# Patient Record
Sex: Male | Born: 2000 | Race: Black or African American | Hispanic: No | Marital: Single | Smoking: Never smoker
Health system: Southern US, Community
[De-identification: ages and names within clinical notes are randomized; demographics above are authoritative.]

---
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
## 2022-03-19 ENCOUNTER — Encounter (HOSPITAL_BASED_OUTPATIENT_CLINIC_OR_DEPARTMENT_OTHER): Payer: Self-pay

## 2022-03-19 ENCOUNTER — Emergency Department (HOSPITAL_BASED_OUTPATIENT_CLINIC_OR_DEPARTMENT_OTHER): Payer: Self-pay | Admitting: Radiology

## 2022-03-19 ENCOUNTER — Other Ambulatory Visit: Payer: Self-pay

## 2022-03-19 ENCOUNTER — Emergency Department (HOSPITAL_BASED_OUTPATIENT_CLINIC_OR_DEPARTMENT_OTHER)
Admission: EM | Admit: 2022-03-19 | Discharge: 2022-03-19 | Disposition: A | Discharge status: Home / Self Care | Payer: Self-pay | Attending: Emergency Medicine | Admitting: Emergency Medicine

## 2022-03-19 DIAGNOSIS — S62357A Nondisplaced fracture of shaft of fifth metacarpal bone, left hand, initial encounter for closed fracture: Secondary | ICD-10-CM

## 2022-03-19 DIAGNOSIS — S62307A Unspecified fracture of fifth metacarpal bone, left hand, initial encounter for closed fracture: Secondary | ICD-10-CM | POA: Insufficient documentation

## 2022-03-19 DIAGNOSIS — W228XXA Striking against or struck by other objects, initial encounter: Secondary | ICD-10-CM | POA: Insufficient documentation

## 2022-03-19 DIAGNOSIS — M79645 Pain in left finger(s): Secondary | ICD-10-CM | POA: Insufficient documentation

## 2022-03-19 DIAGNOSIS — Y9367 Activity, basketball: Secondary | ICD-10-CM | POA: Insufficient documentation

## 2022-03-19 NOTE — ED Provider Notes (Signed)
  [facility] EMERGENCY DEPT Provider Note   CSN: [id number] Arrival date & time: 03/19/22  2227     History  Chief Complaint  Patient presents with   Hand Injury    Alex Mendez is a 21 y.o. male.  Patient is a 21 year old male presenting with left hand pain.  Patient reports playing basketball this evening and running into the pole of the basketball pole.  He reports pain to the left hand that is worse when he moves or palpates.  He denies any alleviating factors.  The history is provided by the patient.  Hand Injury Location:  Hand Hand location:  L hand Pain details:    Quality:  Aching   Radiates to:  Does not radiate   Severity:  Moderate   Onset quality:  Sudden   Timing:  Constant   Progression:  Worsening     Home Medications Prior to Admission medications   Not on File      Allergies    Patient has no known allergies.    Review of Systems   Review of Systems  All other systems reviewed and are negative.  Physical Exam Updated Vital Signs BP (!) 142/93 (BP Location: Right Arm)   Pulse (!) 105   Temp 98.5 F (36.9 C)   Resp 18   Ht 5\' 11"  (1.803 m)   Wt 61.2 kg   SpO2 100%   BMI 18.83 kg/m  Physical Exam Vitals and nursing note reviewed.  Constitutional:      General: He is not in acute distress.    Appearance: Normal appearance. He is not ill-appearing.  HENT:     Head: Normocephalic and atraumatic.  Pulmonary:     Effort: Pulmonary effort is normal.  Musculoskeletal:     Comments: There is swelling and tenderness to the lateral aspect of the left hand over the fifth metacarpal.  Capillary refill is brisk to the fifth finger and sensation is intact.  Skin:    General: Skin is warm and dry.  Neurological:     Mental Status: He is alert.    ED Results / Procedures / Treatments   Labs (all labs ordered are listed, but only abnormal results are displayed) Labs Reviewed - No data to display  EKG None  Radiology DG  Hand Complete Left  Result Date: 03/19/2022 CLINICAL DATA:  Left hand pain/swelling, basketball injury EXAM: LEFT HAND - COMPLETE 3+ VIEW COMPARISON:  None Available. FINDINGS: Midshaft fracture of the 5th metacarpal with mild apex dorsal angulation. Associated mild soft tissue swelling. IMPRESSION: Midshaft 5th metacarpal fracture, as above. Electronically Signed   By: 03/21/2022 M.D.   On: 03/19/2022 22:53    Procedures Procedures    Medications Ordered in ED Medications - No data to display  ED Course/ Medical Decision Making/ A&P  X-rays show 1/5 metacarpal fracture as described in the radiology report.  Patient to be placed in an ulnar gutter splint and will follow-up with hand surgery in the next 1 to 2 days.  In the meantime he is to elevate and ice.  Final Clinical Impression(s) / ED Diagnoses Final diagnoses:  None    Rx / DC Orders ED Discharge Orders     None         03/21/2022, MD 03/19/22 2314

## 2022-03-19 NOTE — ED Triage Notes (Signed)
Patient here POV from Home.  Endorses playing Basketball a few Hours PTA and hitting his Left Posterior Hand on the Pole of the [place].  No Other Injuries. Swelling and Pain to Same.  NAD noted during Triage. A&Ox4. GCS 15. Ambulatory.

## 2022-03-19 NOTE — Discharge Instructions (Signed)
Wear splint as applied until followed up by hand surgery.  Ice for 20 minutes every 2 hours while awake for the next 2 days.  Follow-up in the orthopedic clinic in the next 1 to 2 days.  The contact information for Dr. Greta Doom has been provided in this discharge summary for you to call and make these arrangements.  Take ibuprofen 600 mg every 6 hours as needed for pain.

## 2022-07-24 ENCOUNTER — Ambulatory Visit
Admission: EM | Admit: 2022-07-24 | Discharge: 2022-07-24 | Disposition: A | Discharge status: Home / Self Care | Payer: Self-pay | Attending: Urgent Care | Admitting: Urgent Care

## 2022-07-24 DIAGNOSIS — K047 Periapical abscess without sinus: Secondary | ICD-10-CM

## 2022-07-24 MED ORDER — NAPROXEN 500 MG PO TABS: 500.0000 mg | ORAL_TABLET | Freq: Two times a day (BID) | ORAL | 0 refills | Status: AC

## 2022-07-24 MED ORDER — AMOXICILLIN-POT CLAVULANATE 875-125 MG PO TABS: 1.0000 | ORAL_TABLET | Freq: Two times a day (BID) | ORAL | 0 refills | Status: DC

## 2022-07-24 NOTE — ED Triage Notes (Signed)
Pt states gum pain right side of his mouth with swelling for the past 3 days.

## 2022-07-24 NOTE — Discharge Instructions (Signed)
Make sure you schedule an appointment with a dentist/dental surgeon as soon as possible.  You may try some of the resources below.    [facility] in [place], [place] Address: [address], [place], [place] [address] Phone: [phone]  [facility] [phone] extension [phone]  Dr. Civils [phone] [address]  [facility] [phone] [address]  [facility] [phone] extension [phone] [place], [place], [address] First come first serve for the first 10 clients.  May do simple extractions only, no wisdom teeth or surgery.  You may try the second for Thursday of the month starting at 6:30 AM.  [employer] You may call the school to see if they are still helping to provide dental care for emergent cases.  

## 2022-07-24 NOTE — ED Provider Notes (Signed)
  [facility]  Note:  This document was prepared using Systems analyst and may include unintentional dictation errors.  MRN: [id number] DOB: [birth year]  Subjective:   Alex Mendez is a 21 y.o. male presenting for 3-day history of acute onset persistent and worsening severe right-sided oral pain, facial pain, facial swelling.  Patient does not have a dentist.  No current facility-administered medications for this encounter. No current outpatient medications on file.   No Known Allergies  History reviewed. No pertinent past medical history.   History reviewed. No pertinent surgical history.  History reviewed. No pertinent family history.  Social History   Tobacco Use   Smoking status: Never  Substance Use Topics   Alcohol use: Never   Drug use: Yes    Frequency: 1.0 times per week    Types: Marijuana    ROS   Objective:   Vitals: BP 127/76 (BP Location: Left Arm)   Pulse 80   Temp 98.2 F (36.8 C) (Oral)   Resp 16   SpO2 97%   Physical Exam Constitutional:      General: He is not in acute distress.    Appearance: Normal appearance. He is well-developed and normal weight. He is not ill-appearing, toxic-appearing or diaphoretic.  HENT:     Head: Normocephalic and atraumatic.     Right Ear: External ear normal.     Left Ear: External ear normal.     Nose: Nose normal.     Mouth/Throat:     Pharynx: Oropharynx is clear. No pharyngeal swelling, oropharyngeal exudate, posterior oropharyngeal erythema or uvula swelling.     Tonsils: No tonsillar exudate or tonsillar abscesses. 0 on the right. 0 on the left.      Comments: Poor dentition throughout. Eyes:     General: No scleral icterus.       Right eye: No discharge.        Left eye: No discharge.     Extraocular Movements: Extraocular movements intact.  Cardiovascular:     Rate and Rhythm: Normal rate.  Pulmonary:     Effort: Pulmonary effort is normal.   Musculoskeletal:     Cervical back: Normal range of motion.  Neurological:     Mental Status: He is alert and oriented to person, place, and time.  Psychiatric:        Mood and Affect: Mood normal.        Behavior: Behavior normal.        Thought Content: Thought content normal.        Judgment: Judgment normal.     Assessment and Plan :   PDMP not reviewed this encounter.  1. Dental abscess     Start Augmentin for dental infection/abscess, use naproxen for pain and inflammation. Emphasized need for dental surgeon consult. Counseled patient on potential for adverse effects with medications prescribed/recommended today, strict ER and return-to-clinic precautions discussed, patient verbalized understanding.    Jaynee Eagles, [place] 07/24/22 [phone]

## 2022-11-04 IMAGING — DX DG HAND COMPLETE 3+V*L*
3 series · 3 of 3 positions shown · non-contrast
Comparison: None Available.

CLINICAL DATA: Left hand pain/swelling, basketball injury

EXAM:
LEFT HAND - COMPLETE 3+ VIEW

[hand ap]
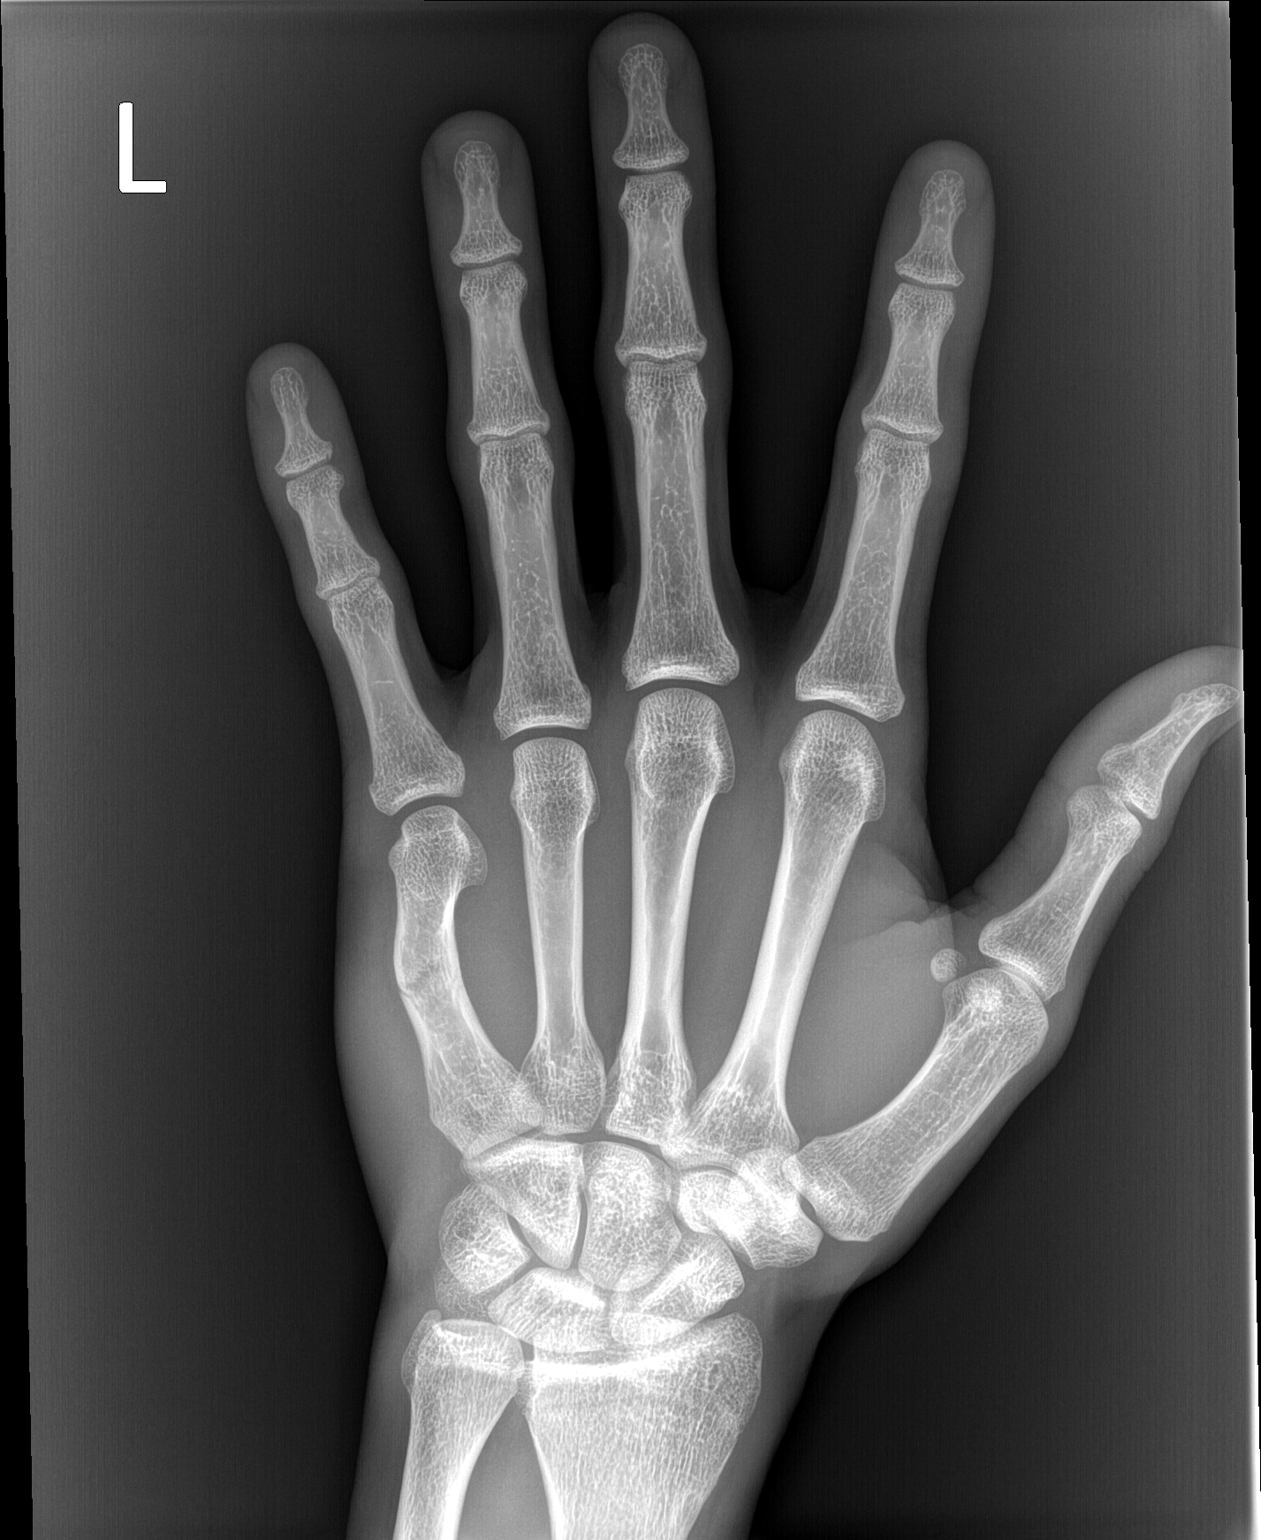

[hand obl]
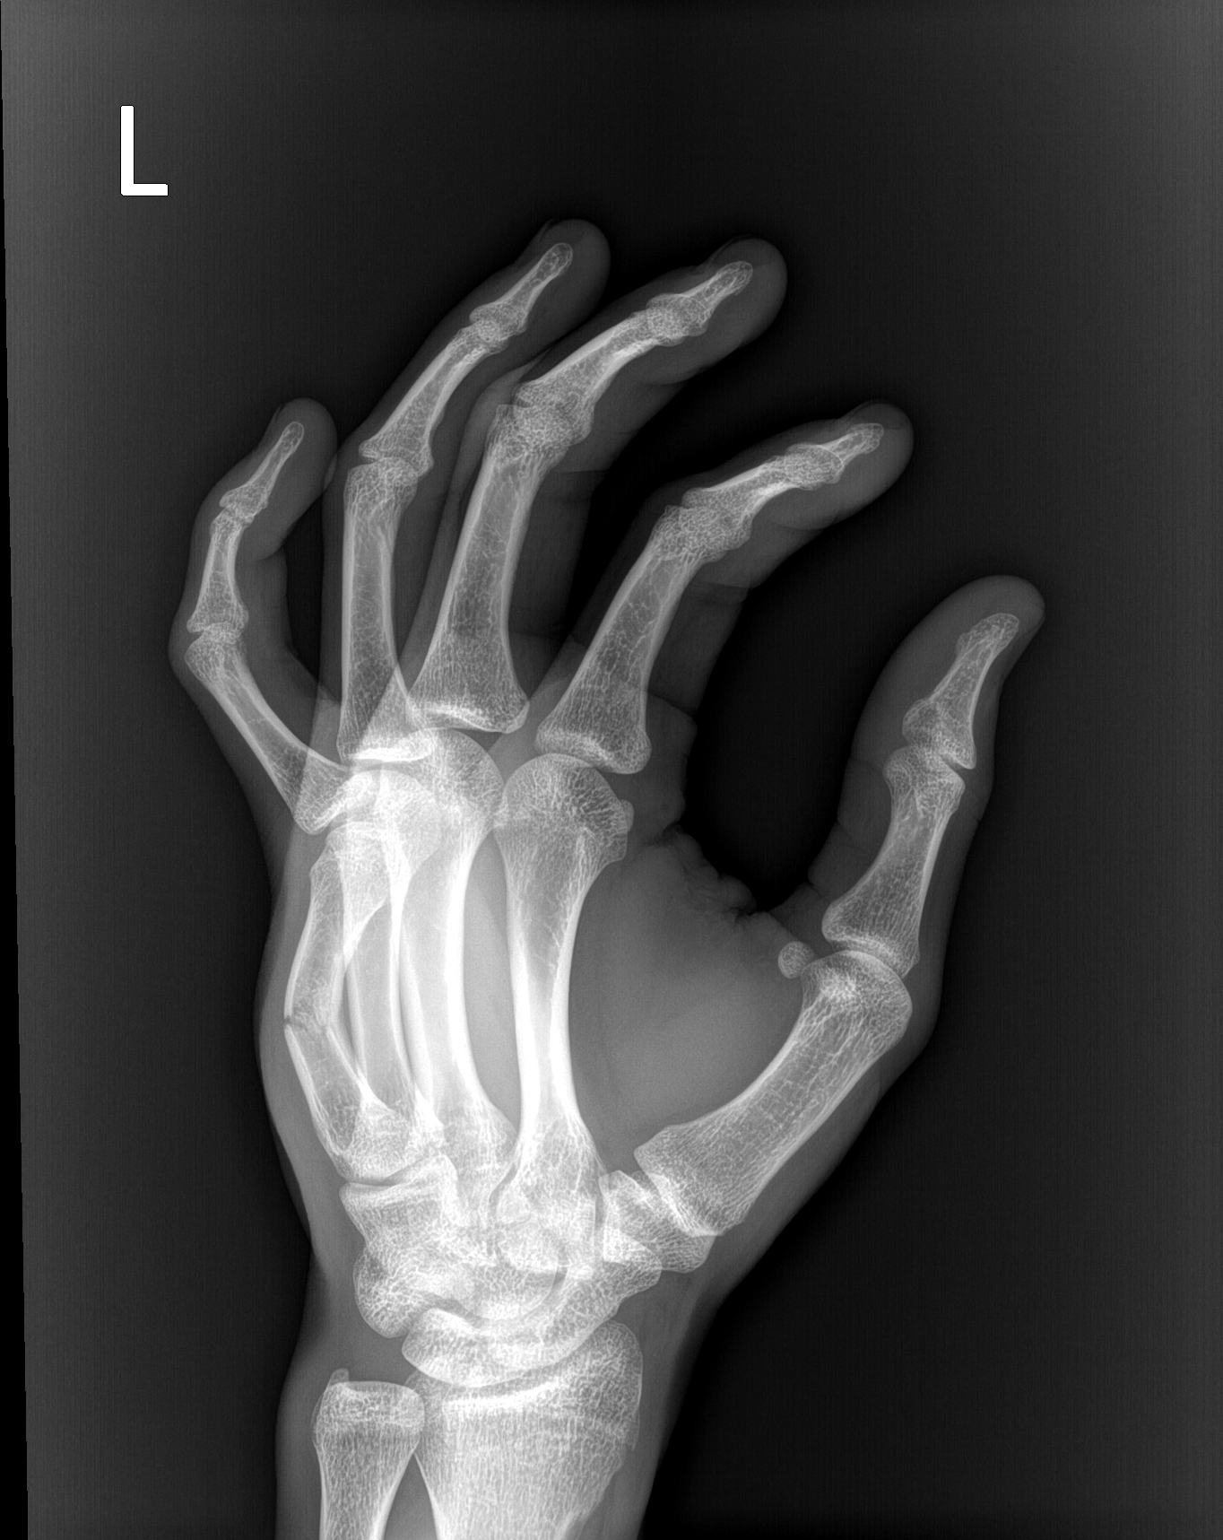

[hand lat]
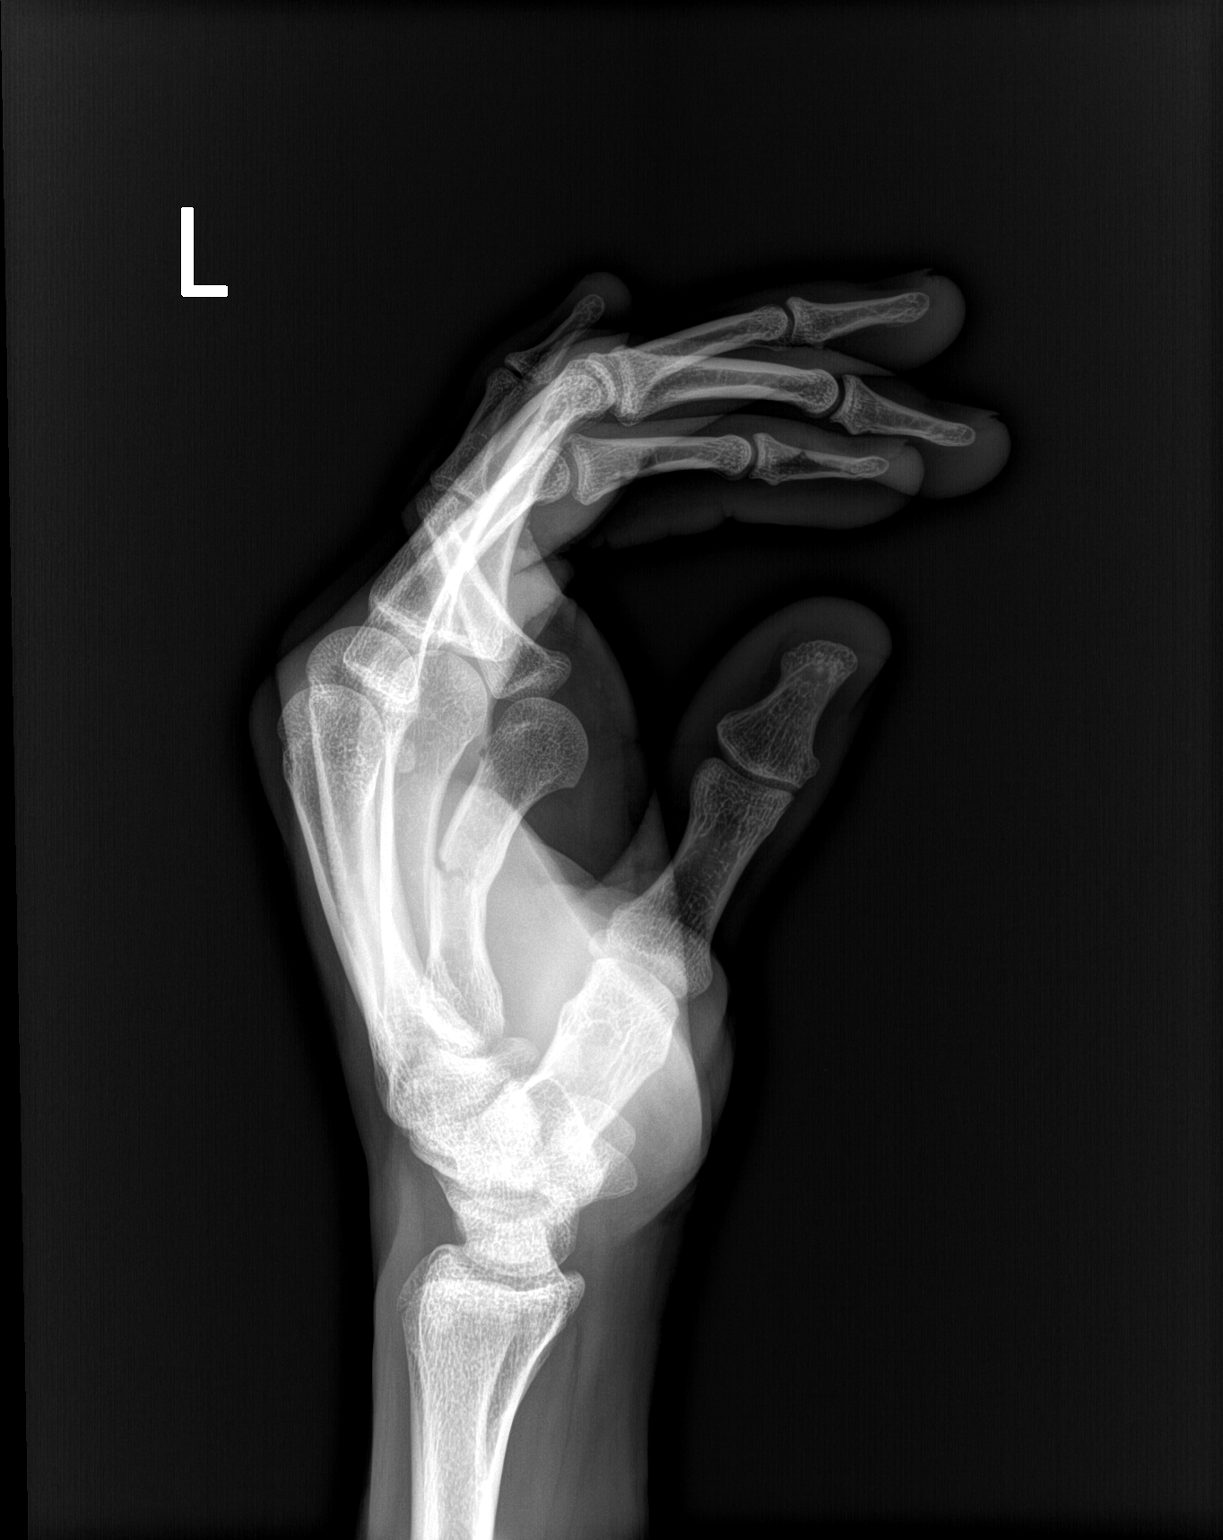

[3 of 3 positions shown; findings below may reference images not displayed]

FINDINGS: Midshaft fracture of the 5th metacarpal with mild apex dorsal
angulation.

Associated mild soft tissue swelling.
IMPRESSION: Midshaft 5th metacarpal fracture, as above.

## 2022-12-25 ENCOUNTER — Emergency Department (HOSPITAL_COMMUNITY)
Admission: EM | Admit: 2022-12-25 | Discharge: 2022-12-25 | Disposition: A | Discharge status: Home / Self Care | Payer: Self-pay | Attending: Emergency Medicine | Admitting: Emergency Medicine

## 2022-12-25 ENCOUNTER — Other Ambulatory Visit: Payer: Self-pay

## 2022-12-25 DIAGNOSIS — R1013 Epigastric pain: Secondary | ICD-10-CM | POA: Insufficient documentation

## 2022-12-25 DIAGNOSIS — R197 Diarrhea, unspecified: Secondary | ICD-10-CM | POA: Insufficient documentation

## 2022-12-25 DIAGNOSIS — Z20822 Contact with and (suspected) exposure to covid-19: Secondary | ICD-10-CM | POA: Insufficient documentation

## 2022-12-25 DIAGNOSIS — R112 Nausea with vomiting, unspecified: Secondary | ICD-10-CM | POA: Insufficient documentation

## 2022-12-25 LAB — URINALYSIS, ROUTINE W REFLEX MICROSCOPIC
Bilirubin Urine: NEGATIVE
Glucose, UA: NEGATIVE mg/dL
Hgb urine dipstick: NEGATIVE
Ketones, ur: NEGATIVE mg/dL
Leukocytes,Ua: NEGATIVE
Nitrite: NEGATIVE
Protein, ur: NEGATIVE mg/dL
Specific Gravity, Urine: 1.004 — ABNORMAL LOW (ref 1.005–1.030)
pH: 6 (ref 5.0–8.0)

## 2022-12-25 LAB — CBC WITH DIFFERENTIAL/PLATELET
Abs Immature Granulocytes: 0.02 10*3/uL (ref 0.00–0.07)
Basophils Absolute: 0 10*3/uL (ref 0.0–0.1)
Basophils Relative: 0 %
Eosinophils Absolute: 0 10*3/uL (ref 0.0–0.5)
Eosinophils Relative: 0 %
HCT: 43.2 % (ref 39.0–52.0)
Hemoglobin: 14.3 g/dL (ref 13.0–17.0)
Immature Granulocytes: 0 %
Lymphocytes Relative: 9 %
Lymphs Abs: 0.7 10*3/uL (ref 0.7–4.0)
MCH: 33.8 pg (ref 26.0–34.0)
MCHC: 33.1 g/dL (ref 30.0–36.0)
MCV: 102.1 fL — ABNORMAL HIGH (ref 80.0–100.0)
Monocytes Absolute: 0.6 10*3/uL (ref 0.1–1.0)
Monocytes Relative: 7 %
Neutro Abs: 6.6 10*3/uL (ref 1.7–7.7)
Neutrophils Relative %: 84 %
Platelets: 189 10*3/uL (ref 150–400)
RBC: 4.23 MIL/uL (ref 4.22–5.81)
RDW: 11.6 % (ref 11.5–15.5)
WBC: 8 10*3/uL (ref 4.0–10.5)
nRBC: 0 % (ref 0.0–0.2)

## 2022-12-25 LAB — LIPASE, BLOOD: Lipase: 26 U/L (ref 11–51)

## 2022-12-25 LAB — RESP PANEL BY RT-PCR (RSV, FLU A&B, COVID)  RVPGX2
Influenza A by PCR: NEGATIVE
Influenza B by PCR: NEGATIVE
Resp Syncytial Virus by PCR: NEGATIVE
SARS Coronavirus 2 by RT PCR: NEGATIVE

## 2022-12-25 LAB — COMPREHENSIVE METABOLIC PANEL
ALT: 14 U/L (ref 0–44)
AST: 22 U/L (ref 15–41)
Albumin: 4.4 g/dL (ref 3.5–5.0)
Alkaline Phosphatase: 92 U/L (ref 38–126)
Anion gap: 9 (ref 5–15)
BUN: 10 mg/dL (ref 6–20)
CO2: 25 mmol/L (ref 22–32)
Calcium: 9.3 mg/dL (ref 8.9–10.3)
Chloride: 105 mmol/L (ref 98–111)
Creatinine, Ser: 1.04 mg/dL (ref 0.61–1.24)
GFR, Estimated: >60 mL/min (ref >60)
Glucose, Bld: 88 mg/dL (ref 70–99)
Potassium: 3.7 mmol/L (ref 3.5–5.1)
Sodium: 139 mmol/L (ref 135–145)
Total Bilirubin: 0.6 mg/dL (ref 0.3–1.2)
Total Protein: 7.7 g/dL (ref 6.5–8.1)

## 2022-12-25 MED ORDER — HYOSCYAMINE SULFATE 0.125 MG SL SUBL
0.2500 mg | SUBLINGUAL_TABLET | Freq: Once | SUBLINGUAL | Status: AC
  Administered 2022-12-25: 0.25 mg via SUBLINGUAL
  Filled 2022-12-25: qty 2

## 2022-12-25 MED ORDER — DIPHENHYDRAMINE HCL 50 MG/ML IJ SOLN
25.0000 mg | Freq: Once | INTRAMUSCULAR | Status: AC
  Administered 2022-12-25: 25 mg via INTRAVENOUS
  Filled 2022-12-25: qty 1

## 2022-12-25 MED ORDER — ONDANSETRON HCL 4 MG/2ML IJ SOLN
4.0000 mg | Freq: Once | INTRAMUSCULAR | Status: AC
  Administered 2022-12-25: 4 mg via INTRAVENOUS
  Filled 2022-12-25: qty 2

## 2022-12-25 MED ORDER — SODIUM CHLORIDE 0.9 % IV BOLUS
1000.0000 mL | Freq: Once | INTRAVENOUS | Status: AC
  Administered 2022-12-25: 1000 mL via INTRAVENOUS

## 2022-12-25 MED ORDER — METOCLOPRAMIDE HCL 5 MG/ML IJ SOLN
10.0000 mg | Freq: Once | INTRAMUSCULAR | Status: AC
  Administered 2022-12-25: 10 mg via INTRAVENOUS
  Filled 2022-12-25: qty 2

## 2022-12-25 MED ORDER — ONDANSETRON 4 MG PO TBDP: 4.0000 mg | ORAL_TABLET | Freq: Three times a day (TID) | ORAL | 0 refills | Status: DC | PRN

## 2022-12-25 MED ORDER — FAMOTIDINE IN NACL 20-0.9 MG/50ML-% IV SOLN
20.0000 mg | Freq: Once | INTRAVENOUS | Status: AC
  Administered 2022-12-25: 20 mg via INTRAVENOUS
  Filled 2022-12-25: qty 50

## 2022-12-25 NOTE — ED Provider Notes (Signed)
[facility] Provider Note   CSN: [id number] Arrival date & time: 12/25/22  1120     History  Chief Complaint  Patient presents with   Abdominal Pain    Rodriguez Aguinaldo is a 22 y.o. male [place] [facility] with a chief complaint of nausea and vomiting.  Patient reports onset of symptoms yesterday morning after come bleeding of full bag of hot Chito puffs.  He began having abdominal pain starting in the epigastrium but spreading to a generalized area.  After that he began having multiple episodes of nausea vomiting and diarrhea.  He reports that he has had both red and green stool.  He is never had anything like this before.  He was unable to stop vomiting prior to arrival here in the emergency department.  He denies fevers, chills, cough.   Abdominal Pain      Home Medications Prior to Admission medications   Medication Sig Start Date End Date Taking? Authorizing Provider  amoxicillin-clavulanate (AUGMENTIN) 875-125 MG tablet Take 1 tablet by mouth 2 (two) times daily. 07/24/22   Jaynee Eagles, PA-C  naproxen (NAPROSYN) 500 MG tablet Take 1 tablet (500 mg total) by mouth 2 (two) times daily with a meal. 07/24/22   Jaynee Eagles, PA-C      Allergies    Patient has no known allergies.    Review of Systems   Review of Systems  Gastrointestinal:  Positive for abdominal pain.    Physical Exam Updated Vital Signs BP (!) 131/99 (BP Location: Right Arm)   Pulse 81   Temp 98 F (36.7 C) (Oral)   Resp 18   Ht 5\' 10"  (1.778 m)   Wt 59 kg   SpO2 98%   BMI 18.65 kg/m  Physical Exam Vitals and nursing note reviewed.  Constitutional:      General: He is not in acute distress.    Appearance: He is well-developed. He is not diaphoretic.  HENT:     Head: Normocephalic and atraumatic.  Eyes:     General: No scleral icterus.    Conjunctiva/sclera: Conjunctivae normal.  Cardiovascular:     Rate and Rhythm: Normal rate and regular  rhythm.     Heart sounds: Normal heart sounds.  Pulmonary:     Effort: Pulmonary effort is normal. No respiratory distress.     Breath sounds: Normal breath sounds.  Abdominal:     Palpations: Abdomen is soft.     Tenderness: There is abdominal tenderness in the right upper quadrant, epigastric area, periumbilical area and left upper quadrant.  Musculoskeletal:     Cervical back: Normal range of motion and neck supple.  Skin:    General: Skin is warm and dry.  Neurological:     Mental Status: He is alert.  Psychiatric:        Behavior: Behavior normal.     ED Results / Procedures / Treatments   Labs (all labs ordered are listed, but only abnormal results are displayed) Labs Reviewed  COMPREHENSIVE METABOLIC PANEL  LIPASE, BLOOD  CBC WITH DIFFERENTIAL/PLATELET  URINALYSIS, ROUTINE W REFLEX MICROSCOPIC    EKG None  Radiology No results found.  Procedures Procedures    Medications Ordered in ED Medications  sodium chloride 0.9 % bolus 1,000 mL (has no administration in time range)  hyoscyamine (LEVSIN SL) SL tablet 0.25 mg (has no administration in time range)  ondansetron (ZOFRAN) injection 4 mg (has no administration in time range)  famotidine (PEPCID) IVPB  20 mg premix (has no administration in time range)    ED Course/ Medical Decision Making/ A&P                             Medical Decision Making 22 year old male here with nausea vomiting and diarrhea. The emergent differential diagnosis for vomiting includes, but is not limited to ACS/MI, DKA, Ischemic bowel, Meningitis, Sepsis, Acute gastric dilation, Adrenal insufficiency, Appendicitis,  Bowel obstruction/ileus, Carbon monoxide poisoning, Cholecystitis, Electrolyte abnormalities, Elevated ICP, Gastric outlet obstruction, Pancreatitis, Ruptured viscus, Biliary colic, Cannabinoid hyperemesis syndrome, Gastritis, Gastroenteritis, Gastroparesis,  Narcotic withdrawal, Peptic ulcer disease, and UTI   I ordered  and reviewed labs.  Panel negative for COVID or influenza UA CMP lipase and CBC without significant abnormality.  Patient given Zofran and fluids however on reevaluation was actively vomiting into a trash can.  I ordered Reglan and Benadryl.  Will need more observation time and p.o. challenge.  Signout given to PA [place] at shift handoff     Amount and/or Complexity of Data Reviewed Labs: ordered.  Risk Prescription drug management.           Final Clinical Impression(s) / ED Diagnoses Final diagnoses:  None    Rx / DC Orders ED Discharge Orders     None         Margarita Mail, PA-C 12/26/22 [place], Ankit, MD 12/27/22 [phone]

## 2022-12-25 NOTE — Discharge Instructions (Addendum)
You came to the emergency department today with abdominal pain, nausea and vomiting.  You are now able to hold down food and drink.  I have sent nausea medication to your pharmacy to use as needed.  We suggest you follow-up with your PCP if you continue to have trouble with recurrent episodes of abdominal discomfort.  With any recurring or worsening symptoms feel free to return to the emergency department.

## 2022-12-25 NOTE — ED Triage Notes (Signed)
Pt BIB EMS from home c/o abd pain and vomiting since this morning

## 2022-12-25 NOTE — ED Provider Notes (Signed)
I received this patient handoff from previous PA.  Please see her note for original history and physical exam thus far.  In short patient is a 22 year old male who presented today with abdominal pain, nausea and vomiting..  The past couple days.  Also potentially cannabinoid hyperemesis.  At this time he is not tolerating p.o.   Physical Exam  BP (!) 140/91 (BP Location: Right Arm)   Pulse 79   Temp 98.3 F (36.8 C) (Oral)   Resp 18   Ht 5\' 10"  (1.778 m)   Wt 59 kg   SpO2 100%   BMI 18.65 kg/m   Physical Exam Vitals and nursing note reviewed.  Constitutional:      Appearance: Normal appearance.  HENT:     Head: Normocephalic and atraumatic.  Eyes:     General: No scleral icterus.    Conjunctiva/sclera: Conjunctivae normal.  Pulmonary:     Effort: Pulmonary effort is normal. No respiratory distress.  Skin:    Findings: No rash.  Neurological:     Mental Status: He is alert.  Psychiatric:        Mood and Affect: Mood normal.     Procedures  Procedures  ED Course / MDM    Medical Decision Making Amount and/or Complexity of Data Reviewed Labs: ordered.  Risk Prescription drug management.   Patient passed p.o. challenge.  Associated symptom pharmacy.  Now discharged.       Darliss Ridgel 12/25/22 1854    Valarie Merino, MD 12/28/22 [phone]

## 2023-04-14 ENCOUNTER — Emergency Department
Admission: EM | Admit: 2023-04-14 | Discharge: 2023-04-14 | Discharge status: Against Medical Advice | Payer: Self-pay | Attending: Emergency Medicine | Admitting: Emergency Medicine

## 2023-04-14 ENCOUNTER — Other Ambulatory Visit: Payer: Self-pay

## 2023-04-14 DIAGNOSIS — Z5329 Procedure and treatment not carried out because of patient's decision for other reasons: Secondary | ICD-10-CM | POA: Insufficient documentation

## 2023-04-14 DIAGNOSIS — R300 Dysuria: Secondary | ICD-10-CM | POA: Insufficient documentation

## 2023-04-14 DIAGNOSIS — R112 Nausea with vomiting, unspecified: Secondary | ICD-10-CM | POA: Insufficient documentation

## 2023-04-14 DIAGNOSIS — R1084 Generalized abdominal pain: Secondary | ICD-10-CM | POA: Insufficient documentation

## 2023-04-14 DIAGNOSIS — R110 Nausea: Secondary | ICD-10-CM

## 2023-04-14 LAB — URINALYSIS, ROUTINE W REFLEX MICROSCOPIC
Bacteria, UA: NONE SEEN
Bilirubin Urine: NEGATIVE
Glucose, UA: NEGATIVE mg/dL
Hgb urine dipstick: NEGATIVE
Ketones, ur: NEGATIVE mg/dL
Nitrite: NEGATIVE
Protein, ur: 30 mg/dL — AB
Specific Gravity, Urine: 1.02 (ref 1.005–1.030)
pH: 6 (ref 5.0–8.0)

## 2023-04-14 LAB — COMPREHENSIVE METABOLIC PANEL
ALT: 25 U/L (ref 0–44)
AST: 53 U/L — ABNORMAL HIGH (ref 15–41)
Albumin: 4.4 g/dL (ref 3.5–5.0)
Alkaline Phosphatase: 97 U/L (ref 38–126)
Anion gap: 6 (ref 5–15)
BUN: 11 mg/dL (ref 6–20)
CO2: 26 mmol/L (ref 22–32)
Calcium: 9.7 mg/dL (ref 8.9–10.3)
Chloride: 107 mmol/L (ref 98–111)
Creatinine, Ser: 1.28 mg/dL — ABNORMAL HIGH (ref 0.61–1.24)
GFR, Estimated: >60 mL/min (ref >60)
Glucose, Bld: 86 mg/dL (ref 70–99)
Potassium: 3.9 mmol/L (ref 3.5–5.1)
Sodium: 139 mmol/L (ref 135–145)
Total Bilirubin: 0.8 mg/dL (ref 0.3–1.2)
Total Protein: 7.4 g/dL (ref 6.5–8.1)

## 2023-04-14 LAB — CBC
HCT: 41.3 % (ref 39.0–52.0)
Hemoglobin: 13.4 g/dL (ref 13.0–17.0)
MCH: 33.2 pg (ref 26.0–34.0)
MCHC: 32.4 g/dL (ref 30.0–36.0)
MCV: 102.2 fL — ABNORMAL HIGH (ref 80.0–100.0)
Platelets: 194 10*3/uL (ref 150–400)
RBC: 4.04 MIL/uL — ABNORMAL LOW (ref 4.22–5.81)
RDW: 11.6 % (ref 11.5–15.5)
WBC: 4.7 10*3/uL (ref 4.0–10.5)
nRBC: 0 % (ref 0.0–0.2)

## 2023-04-14 LAB — LIPASE, BLOOD: Lipase: 21 U/L (ref 11–51)

## 2023-04-14 MED ORDER — CEPHALEXIN 500 MG PO CAPS: 500.0000 mg | ORAL_CAPSULE | Freq: Four times a day (QID) | ORAL | 0 refills | Status: AC

## 2023-04-14 MED ORDER — SODIUM CHLORIDE 0.9 % IV BOLUS: 1000.0000 mL | Freq: Once | INTRAVENOUS | Status: DC

## 2023-04-14 MED ORDER — CEPHALEXIN 500 MG PO CAPS: 500.0000 mg | ORAL_CAPSULE | Freq: Once | ORAL | Status: DC

## 2023-04-14 MED ORDER — ONDANSETRON 4 MG PO TBDP: 4.0000 mg | ORAL_TABLET | Freq: Three times a day (TID) | ORAL | 0 refills | Status: AC | PRN

## 2023-04-14 MED ORDER — DROPERIDOL 2.5 MG/ML IJ SOLN: 2.5000 mg | Freq: Once | INTRAMUSCULAR | Status: DC

## 2023-04-14 NOTE — ED Provider Notes (Signed)
[facility] Provider Note    Event Date/Time   First MD Initiated Contact with Patient 04/14/23 0240     (approximate)   History   Abdominal Pain   HPI  Alex Mendez is a 22 y.o. male   Past medical history of no significant past medical history presents emergency department with Nausea, dry heaving for months.  Was evaluated several months ago in the emergency department with unchanged quality severity location of symptoms.  He describes bilateral lower abdominal pain.  Dry heaving and occasional vomiting, poor p.o. intake.  Some dysuria.  No fever.  No surgical history.  No testicular pain.  No penile discharge.  Ongoing marijuana use.   External Medical Documents Reviewed: Emergency department visit dated March 2024 for nausea vomiting solved with medications thought to be cannabis related.      Physical Exam   Triage Vital Signs: ED Triage Vitals  Enc Vitals Group     BP 04/14/23 0151 (!) 141/84     Pulse Rate 04/14/23 0151 96     Resp 04/14/23 0151 16     Temp 04/14/23 0151 98 F (36.7 C)     Temp Source 04/14/23 0151 Oral     SpO2 04/14/23 0151 100 %     Weight 04/14/23 0148 132 lb 4.4 oz (60 kg)     Height 04/14/23 0148 5\' 10"  (1.778 m)     Head Circumference --      Peak Flow --      Pain Score 04/14/23 0151 10     Pain Loc --      Pain Edu? --      Excl. in GC? --     Most recent vital signs: Vitals:   04/14/23 0151  BP: (!) 141/84  Pulse: 96  Resp: 16  Temp: 98 F (36.7 C)  SpO2: 100%    General: Awake, no distress.  CV:  Good peripheral perfusion.  Resp:  Normal effort.  Abd:  No distention.  Other:  Awake alert comfortable mild right lower quadrant tenderness to palpation without rigidity or guarding.  Hemodynamics appropriate reassuring afebrile.   ED Results / Procedures / Treatments   Labs (all labs ordered are listed, but only abnormal results are displayed) Labs Reviewed  COMPREHENSIVE METABOLIC PANEL -  Abnormal; Notable for the following components:      Result Value   Creatinine, Ser 1.28 (*)    AST 53 (*)    All other components within normal limits  CBC - Abnormal; Notable for the following components:   RBC 4.04 (*)    MCV 102.2 (*)    All other components within normal limits  URINALYSIS, ROUTINE W REFLEX MICROSCOPIC - Abnormal; Notable for the following components:   Color, Urine YELLOW (*)    APPearance HAZY (*)    Protein, ur 30 (*)    Leukocytes,Ua TRACE (*)    All other components within normal limits  LIPASE, BLOOD     I ordered and reviewed the above labs they are notable for creatinine is 1.28 slightly elevated from prior.  White blood cell count is normal.     PROCEDURES:  Critical Care performed: No  Procedures   MEDICATIONS ORDERED IN ED: Medications  sodium chloride 0.9 % bolus 1,000 mL (has no administration in time range)  droperidol (INAPSINE) 2.5 MG/ML injection 2.5 mg (has no administration in time range)  cephALEXin (KEFLEX) capsule 500 mg (has no administration in time range)  IMPRESSION / MDM / ASSESSMENT AND PLAN / ED COURSE  I reviewed the triage vital signs and the nursing notes.                                Patient's presentation is most consistent with acute presentation with potential threat to life or bodily function.  Differential diagnosis includes, but is not limited to, gastroenteritis, urinary tract infection, testicular torsion, appendicitis, diverticulitis, cholecystitis  The patient is on the cardiac monitor to evaluate for evidence of arrhythmia and/or significant heart rate changes.  MDM:    This patient with ongoing nausea dry heaving abdominal pain mild dysuria for several months.  His exam shows some mild right lower quadrant tenderness concern for appendicitis we will order a CAT scan.  Medicate with IV crystalloid, antiemetic, check urinalysis.  I spoke with the patient after his lab testing and informed him  of questionable urinary tract infection and will prescribe antibiotics and informed of the need for CT scan rule intra-abdominal infection surgical pathologies he was in agreement.  He was initially hesitant for IV but then agreeable.  Shortly thereafter the patient eloped.         FINAL CLINICAL IMPRESSION(S) / ED DIAGNOSES   Final diagnoses:  Generalized abdominal pain  Nausea  Dysuria     Rx / DC Orders   ED Discharge Orders          Ordered    ondansetron (ZOFRAN-ODT) 4 MG disintegrating tablet  Every 8 hours PRN        04/14/23 0326    cephALEXin (KEFLEX) 500 MG capsule  4 times daily        04/14/23 0326             Note:  This document was prepared using Dragon voice recognition software and may include unintentional dictation errors.    Pilar Jarvis, MD 04/14/23 [phone]

## 2023-04-14 NOTE — ED Notes (Signed)
Patient is scared of needles, and did not want to have an IV inserted.  Patient decided that he could deal with his abdominal pain, got up and left.  Dr. Modesto Charon was notified that the patient left without receiving the treatment ordered.

## 2023-04-14 NOTE — ED Triage Notes (Signed)
Pt to ed from home via POV for abd pain with nausea. Pt was recently seen at [facility] in march for same symptoms and was diagnosed with Also potentially cannabinoid hyperemesis. Pt admits to smoking marijuana. Pt is CAOx4, in no acute distress and ambulatory in triage.

## 2023-09-06 ENCOUNTER — Ambulatory Visit (HOSPITAL_COMMUNITY)
Admission: EM | Admit: 2023-09-06 | Discharge: 2023-09-06 | Disposition: A | Discharge status: Home / Self Care | Payer: Self-pay | Attending: Emergency Medicine | Admitting: Emergency Medicine

## 2023-09-06 ENCOUNTER — Encounter (HOSPITAL_COMMUNITY): Payer: Self-pay | Admitting: Emergency Medicine

## 2023-09-06 DIAGNOSIS — Z202 Contact with and (suspected) exposure to infections with a predominantly sexual mode of transmission: Secondary | ICD-10-CM | POA: Insufficient documentation

## 2023-09-06 DIAGNOSIS — Z113 Encounter for screening for infections with a predominantly sexual mode of transmission: Secondary | ICD-10-CM | POA: Insufficient documentation

## 2023-09-06 MED ORDER — DOXYCYCLINE HYCLATE 100 MG PO CAPS: 100.0000 mg | ORAL_CAPSULE | Freq: Two times a day (BID) | ORAL | 0 refills | Status: AC

## 2023-09-06 NOTE — ED Triage Notes (Signed)
Pt here for STD testing after girlfriend tested positive for chlamydia.

## 2023-09-06 NOTE — ED Provider Notes (Signed)
[facility]    CSN: [id number] Arrival date & time: 09/06/23  1037     History   Chief Complaint Chief Complaint  Patient presents with   SEXUALLY TRANSMITTED DISEASE    HPI Alex Mendez is a 22 y.o. male.  Girlfriend tested positive for chlamydia Patient having penile discharge and urethral irritation No rash or lesions   History reviewed. No pertinent past medical history.  There are no problems to display for this patient.   History reviewed. No pertinent surgical history.     Home Medications    Prior to Admission medications   Medication Sig Start Date End Date Taking? Authorizing Provider  doxycycline (VIBRAMYCIN) 100 MG capsule Take 1 capsule (100 mg total) by mouth 2 (two) times daily for 7 days. 09/06/23 09/13/23 Yes Arlis Yale, Lurena Joiner, PA-C  naproxen (NAPROSYN) 500 MG tablet Take 1 tablet (500 mg total) by mouth 2 (two) times daily with a meal. 07/24/22   Wallis Bamberg, PA-C  ondansetron (ZOFRAN-ODT) 4 MG disintegrating tablet Take 1 tablet (4 mg total) by mouth every 8 (eight) hours as needed for nausea or vomiting. 04/14/23   Pilar Jarvis, MD    Family History History reviewed. No pertinent family history.  Social History Social History   Tobacco Use   Smoking status: Never  Substance Use Topics   Alcohol use: Never   Drug use: Yes    Frequency: 1.0 times per week    Types: Marijuana     Allergies   Patient has no known allergies.   Review of Systems Review of Systems   Physical Exam Triage Vital Signs ED Triage Vitals  Encounter Vitals Group     BP 09/06/23 1140 (!) 163/82     Systolic BP Percentile --      Diastolic BP Percentile --      Pulse Rate 09/06/23 1140 75     Resp 09/06/23 1140 16     Temp 09/06/23 1140 97.9 F (36.6 C)     Temp src --      SpO2 09/06/23 1140 96 %     Weight --      Height --      Head Circumference --      Peak Flow --      Pain Score 09/06/23 1145 0     Pain Loc --      Pain Education  --      Exclude from Growth Chart --    No data found.  Updated Vital Signs BP (!) 163/82 (BP Location: Right Arm)   Pulse 75   Temp 97.9 F (36.6 C)   Resp 16   SpO2 96%   Visual Acuity Right Eye Distance:   Left Eye Distance:   Bilateral Distance:    Right Eye Near:   Left Eye Near:    Bilateral Near:     Physical Exam Vitals and nursing note reviewed.  Constitutional:      General: He is not in acute distress.    Appearance: Normal appearance.  Cardiovascular:     Rate and Rhythm: Normal rate and regular rhythm.     Heart sounds: Normal heart sounds.  Pulmonary:     Effort: Pulmonary effort is normal.     Breath sounds: Normal breath sounds.  Neurological:     Mental Status: He is alert and oriented to person, place, and time.      UC Treatments / Results  Labs (all labs ordered are listed, but only  abnormal results are displayed) Labs Reviewed  CYTOLOGY, (ORAL, ANAL, URETHRAL) ANCILLARY ONLY    EKG   Radiology No results found.  Procedures Procedures (including critical care time)  Medications Ordered in UC Medications - No data to display  Initial Impression / Assessment and Plan / UC Course  I have reviewed the triage vital signs and the nursing notes.  Pertinent labs & imaging results that were available during my care of the patient were reviewed by me and considered in my medical decision making (see chart for details).  Cytology swab pending Treat for chlamydia exposure with doxy BID x 7 Discussed side effects, safe sex precautions All questions answered  Final Clinical Impressions(s) / UC Diagnoses   Final diagnoses:  Exposure to chlamydia  Screen for STD (sexually transmitted disease)     Discharge Instructions      Please take medication as prescribed. Take with food to avoid upset stomach. Take ALL of the pills!  Please abstain from sexual intercourse until 7 days after completion of medication      ED Prescriptions      Medication Sig Dispense Auth. Provider   doxycycline (VIBRAMYCIN) 100 MG capsule Take 1 capsule (100 mg total) by mouth 2 (two) times daily for 7 days. 14 capsule Noah Pelaez, Lurena Joiner, PA-C      PDMP not reviewed this encounter.   Marlow Baars, [place] 09/06/23 1220

## 2023-09-06 NOTE — Discharge Instructions (Signed)
Please take medication as prescribed. Take with food to avoid upset stomach. Take ALL of the pills!  Please abstain from sexual intercourse until 7 days after completion of medication

## 2023-09-08 LAB — CYTOLOGY, (ORAL, ANAL, URETHRAL) ANCILLARY ONLY
Chlamydia: POSITIVE — AB
Comment: NEGATIVE
Comment: NEGATIVE
Comment: NORMAL
Neisseria Gonorrhea: NEGATIVE
Trichomonas: NEGATIVE

## 2023-09-09 ENCOUNTER — Telehealth (HOSPITAL_BASED_OUTPATIENT_CLINIC_OR_DEPARTMENT_OTHER): Payer: Self-pay

## 2023-11-15 ENCOUNTER — Emergency Department (HOSPITAL_COMMUNITY)
Admission: EM | Admit: 2023-11-15 | Discharge: 2023-11-15 | Disposition: A | Discharge status: Against Medical Advice | Payer: Self-pay | Attending: Emergency Medicine | Admitting: Emergency Medicine

## 2023-11-15 DIAGNOSIS — J101 Influenza due to other identified influenza virus with other respiratory manifestations: Secondary | ICD-10-CM | POA: Insufficient documentation

## 2023-11-15 DIAGNOSIS — Z20822 Contact with and (suspected) exposure to covid-19: Secondary | ICD-10-CM | POA: Insufficient documentation

## 2023-11-15 DIAGNOSIS — Z5321 Procedure and treatment not carried out due to patient leaving prior to being seen by health care provider: Secondary | ICD-10-CM | POA: Insufficient documentation

## 2023-11-15 LAB — RESP PANEL BY RT-PCR (RSV, FLU A&B, COVID)  RVPGX2
Influenza A by PCR: POSITIVE — AB
Influenza B by PCR: NEGATIVE
Resp Syncytial Virus by PCR: NEGATIVE
SARS Coronavirus 2 by RT PCR: NEGATIVE

## 2023-11-15 NOTE — ED Triage Notes (Signed)
Patient c/o fever Lightheaded Nausea x 2-3 days Denies vomiting Denies any sick contacts Patient unable to check temperature at home Reports taking tylenol this morning

## 2024-04-13 ENCOUNTER — Encounter (HOSPITAL_COMMUNITY): Payer: Self-pay | Admitting: Emergency Medicine

## 2024-04-13 ENCOUNTER — Emergency Department (HOSPITAL_COMMUNITY)
Admission: EM | Admit: 2024-04-13 | Discharge: 2024-04-13 | Discharge status: Against Medical Advice | Payer: Self-pay | Attending: Emergency Medicine | Admitting: Emergency Medicine

## 2024-04-13 ENCOUNTER — Other Ambulatory Visit: Payer: Self-pay

## 2024-04-13 DIAGNOSIS — X30XXXA Exposure to excessive natural heat, initial encounter: Secondary | ICD-10-CM | POA: Insufficient documentation

## 2024-04-13 DIAGNOSIS — T675XXA Heat exhaustion, unspecified, initial encounter: Secondary | ICD-10-CM | POA: Insufficient documentation

## 2024-04-13 DIAGNOSIS — Z5321 Procedure and treatment not carried out due to patient leaving prior to being seen by health care provider: Secondary | ICD-10-CM | POA: Insufficient documentation

## 2024-04-13 DIAGNOSIS — R110 Nausea: Secondary | ICD-10-CM | POA: Insufficient documentation

## 2024-04-13 LAB — COMPREHENSIVE METABOLIC PANEL WITH GFR
ALT: 20 U/L (ref 0–44)
AST: 31 U/L (ref 15–41)
Albumin: 4.3 g/dL (ref 3.5–5.0)
Alkaline Phosphatase: 79 U/L (ref 38–126)
Anion gap: 9 (ref 5–15)
BUN: 18 mg/dL (ref 6–20)
CO2: 26 mmol/L (ref 22–32)
Calcium: 9.5 mg/dL (ref 8.9–10.3)
Chloride: 104 mmol/L (ref 98–111)
Creatinine, Ser: 1.17 mg/dL (ref 0.61–1.24)
GFR, Estimated: >60 mL/min (ref >60)
Glucose, Bld: 74 mg/dL (ref 70–99)
Potassium: 3.8 mmol/L (ref 3.5–5.1)
Sodium: 139 mmol/L (ref 135–145)
Total Bilirubin: 0.7 mg/dL (ref 0.0–1.2)
Total Protein: 7 g/dL (ref 6.5–8.1)

## 2024-04-13 LAB — CBC WITH DIFFERENTIAL/PLATELET
Abs Immature Granulocytes: 0.01 10*3/uL (ref 0.00–0.07)
Basophils Absolute: 0 10*3/uL (ref 0.0–0.1)
Basophils Relative: 1 %
Eosinophils Absolute: 0 10*3/uL (ref 0.0–0.5)
Eosinophils Relative: 0 %
HCT: 39.2 % (ref 39.0–52.0)
Hemoglobin: 12.6 g/dL — ABNORMAL LOW (ref 13.0–17.0)
Immature Granulocytes: 0 %
Lymphocytes Relative: 51 %
Lymphs Abs: 2.5 10*3/uL (ref 0.7–4.0)
MCH: 34.2 pg — ABNORMAL HIGH (ref 26.0–34.0)
MCHC: 32.1 g/dL (ref 30.0–36.0)
MCV: 106.5 fL — ABNORMAL HIGH (ref 80.0–100.0)
Monocytes Absolute: 0.5 10*3/uL (ref 0.1–1.0)
Monocytes Relative: 10 %
Neutro Abs: 1.9 10*3/uL (ref 1.7–7.7)
Neutrophils Relative %: 38 %
Platelets: 193 10*3/uL (ref 150–400)
RBC: 3.68 MIL/uL — ABNORMAL LOW (ref 4.22–5.81)
RDW: 11.9 % (ref 11.5–15.5)
WBC: 5 10*3/uL (ref 4.0–10.5)
nRBC: 0 % (ref 0.0–0.2)

## 2024-04-13 NOTE — ED Triage Notes (Signed)
 Patient report he don't have AC at home and his been outside today. Patient report nausea, denies vomiting. Patient denies headache and dizziness.
